# Patient Record
Sex: Male | Born: 2005 | Race: White | Hispanic: No | Marital: Single | Smoking: Never smoker
Health system: Southern US, Community
[De-identification: ages and names within clinical notes are randomized; demographics above are authoritative.]

## PROBLEM LIST (undated history)

## (undated) DIAGNOSIS — F909 Attention-deficit hyperactivity disorder, unspecified type: Secondary | ICD-10-CM

## (undated) DIAGNOSIS — F840 Autistic disorder: Secondary | ICD-10-CM

## (undated) HISTORY — PX: ABDOMINAL SURGERY: SHX537

## (undated) HISTORY — PX: FRACTURE SURGERY: SHX138

---
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
## 2024-04-13 ENCOUNTER — Emergency Department
Admission: EM | Admit: 2024-04-13 | Discharge: 2024-04-13 | Disposition: A | Discharge status: Home / Self Care | Payer: MEDICAID | Attending: Emergency Medicine | Admitting: Emergency Medicine

## 2024-04-13 ENCOUNTER — Other Ambulatory Visit: Payer: Self-pay

## 2024-04-13 DIAGNOSIS — K219 Gastro-esophageal reflux disease without esophagitis: Secondary | ICD-10-CM | POA: Diagnosis not present

## 2024-04-13 DIAGNOSIS — R109 Unspecified abdominal pain: Secondary | ICD-10-CM | POA: Diagnosis present

## 2024-04-13 LAB — COMPREHENSIVE METABOLIC PANEL WITH GFR
ALT: 69 U/L — ABNORMAL HIGH (ref 0–44)
AST: 25 U/L (ref 15–41)
Albumin: 4.5 g/dL (ref 3.5–5.0)
Alkaline Phosphatase: 99 U/L (ref 38–126)
Anion gap: 10 (ref 5–15)
BUN: 9 mg/dL (ref 6–20)
CO2: 24 mmol/L (ref 22–32)
Calcium: 9.4 mg/dL (ref 8.9–10.3)
Chloride: 104 mmol/L (ref 98–111)
Creatinine, Ser: 0.79 mg/dL (ref 0.61–1.24)
GFR, Estimated: >60 mL/min (ref >60)
Glucose, Bld: 112 mg/dL — ABNORMAL HIGH (ref 70–99)
Potassium: 3.8 mmol/L (ref 3.5–5.1)
Sodium: 138 mmol/L (ref 135–145)
Total Bilirubin: 0.9 mg/dL (ref 0.0–1.2)
Total Protein: 7.9 g/dL (ref 6.5–8.1)

## 2024-04-13 LAB — CBC
HCT: 48 % (ref 39.0–52.0)
Hemoglobin: 16.5 g/dL (ref 13.0–17.0)
MCH: 29.3 pg (ref 26.0–34.0)
MCHC: 34.4 g/dL (ref 30.0–36.0)
MCV: 85.3 fL (ref 80.0–100.0)
Platelets: 363 K/uL (ref 150–400)
RBC: 5.63 MIL/uL (ref 4.22–5.81)
RDW: 12.4 % (ref 11.5–15.5)
WBC: 6.8 K/uL (ref 4.0–10.5)
nRBC: 0 % (ref 0.0–0.2)

## 2024-04-13 LAB — LIPASE, BLOOD: Lipase: 28 U/L (ref 11–51)

## 2024-04-13 NOTE — ED Triage Notes (Signed)
 First Nurse Note: Patient to ED via [facility] from home for vomiting with blood. Hx of acid reflux- takes meds for same. VS WNL

## 2024-04-13 NOTE — ED Provider Notes (Signed)
 [facility] Provider Note    Event Date/Time   First MD Initiated Contact with Patient 04/13/24 1113     (approximate)   History   Abdominal Pain   HPI  Dakota Ward is a 18 y.o. male who presents with complaints of chronic nausea and vomiting.  Patient reports his PCP has prescribed him antacids for this and this seems to have helped significantly.  However he reports it is flared up again over the last several days, it only happens in the morning.  States he does not use marijuana.  No abdominal pain reported to me.  No blood in vomitus or stool, normal stools.     Physical Exam   Triage Vital Signs: ED Triage Vitals [04/13/24 1037]  Encounter Vitals Group     BP (!) 156/93     Girls Systolic BP Percentile      Girls Diastolic BP Percentile      Boys Systolic BP Percentile      Boys Diastolic BP Percentile      Pulse Rate (!) 105     Resp 16     Temp 98.5 F (36.9 C)     Temp Source Oral     SpO2 95 %     Weight 35.5 kg (78 lb 4.2 oz)     Height      Head Circumference      Peak Flow      Pain Score 1     Pain Loc      Pain Education      Exclude from Growth Chart     Most recent vital signs: Vitals:   04/13/24 1037  BP: (!) 156/93  Pulse: (!) 105  Resp: 16  Temp: 98.5 F (36.9 C)  SpO2: 95%     General: Awake, no distress.  CV:  Good peripheral perfusion.  Resp:  Normal effort.  Abd:  No distention.  Soft, nontender Other:     ED Results / Procedures / Treatments   Labs (all labs ordered are listed, but only abnormal results are displayed) Labs Reviewed  COMPREHENSIVE METABOLIC PANEL WITH GFR - Abnormal; Notable for the following components:      Result Value   Glucose, Bld 112 (*)    ALT 69 (*)    All other components within normal limits  LIPASE, BLOOD  CBC  URINALYSIS, ROUTINE W REFLEX MICROSCOPIC     EKG     RADIOLOGY     PROCEDURES:  Critical Care performed:   Procedures   MEDICATIONS  ORDERED IN ED: Medications - No data to display   IMPRESSION / MDM / ASSESSMENT AND PLAN / ED COURSE  I reviewed the triage vital signs and the nursing notes. Patient's presentation is most consistent with exacerbation of chronic illness.  Patient presents with nausea and vomiting as detailed above, this is a chronic issue for him, does seem to be related to acid reflux, we discussed dietary changes, timing of medication, stopping p.o. intake 1 to 2 hours prior to bed, close follow-up with PCP.  He has no abdominal pain, benign exam.  Lab work reviewed and is unremarkable.  Appropriate for discharge with continued outpatient workup      FINAL CLINICAL IMPRESSION(S) / ED DIAGNOSES   Final diagnoses:  Gastroesophageal reflux disease, unspecified whether esophagitis present     Rx / DC Orders   ED Discharge Orders     None        Note:  This document was prepared using Dragon voice recognition software and may include unintentional dictation errors.   Arlander Charleston, MD 04/13/24 [phone]

## 2024-04-13 NOTE — ED Triage Notes (Signed)
 C/O abdominal pain and vomiting.  Has been ongoing since taking ?prilosec. Has history of acid reflux.  States emesis is dark brown.  AAOx3.  Skin warm and dry. NAD
